# Patient Record
Sex: Male | Born: 1960 | Race: White | Hispanic: No | Marital: Married | Smoking: Never smoker
Health system: Southern US, Community
[De-identification: ages and names within clinical notes are randomized; demographics above are authoritative.]

---

## 1997-02-27 ENCOUNTER — Ambulatory Visit: Admission: RE | Admit: 1997-02-27 | Payer: Self-pay | Source: Ambulatory Visit | Admitting: Orthopaedic Surgery

## 1998-08-07 ENCOUNTER — Ambulatory Visit: Admission: RE | Admit: 1998-08-07 | Payer: Self-pay | Source: Ambulatory Visit | Admitting: Specialist

## 2008-05-09 DIAGNOSIS — H9319 Tinnitus, unspecified ear: Secondary | ICD-10-CM | POA: Insufficient documentation

## 2008-05-09 DIAGNOSIS — H903 Sensorineural hearing loss, bilateral: Secondary | ICD-10-CM | POA: Insufficient documentation

## 2008-05-24 DIAGNOSIS — J342 Deviated nasal septum: Secondary | ICD-10-CM | POA: Insufficient documentation

## 2008-06-30 ENCOUNTER — Ambulatory Visit
Admission: RE | Admit: 2008-06-30 | Disposition: A | Discharge status: Home / Self Care | Payer: Self-pay | Source: Ambulatory Visit | Admitting: Pediatric Otolaryngology

## 2008-10-16 DIAGNOSIS — J3489 Other specified disorders of nose and nasal sinuses: Secondary | ICD-10-CM | POA: Insufficient documentation

## 2009-05-25 ENCOUNTER — Ambulatory Visit
Admit: 2009-05-25 | Disposition: A | Discharge status: Home / Self Care | Payer: Self-pay | Source: Ambulatory Visit | Admitting: Internal Medicine

## 2019-04-15 ENCOUNTER — Encounter (INDEPENDENT_AMBULATORY_CARE_PROVIDER_SITE_OTHER): Payer: Self-pay

## 2020-01-03 ENCOUNTER — Emergency Department
Admission: EM | Admit: 2020-01-03 | Discharge: 2020-01-03 | Disposition: A | Discharge status: Home / Self Care | Payer: No Typology Code available for payment source | Attending: Emergency Medicine | Admitting: Emergency Medicine

## 2020-01-03 ENCOUNTER — Emergency Department: Payer: No Typology Code available for payment source

## 2020-01-03 DIAGNOSIS — R748 Abnormal levels of other serum enzymes: Secondary | ICD-10-CM | POA: Insufficient documentation

## 2020-01-03 DIAGNOSIS — R0602 Shortness of breath: Secondary | ICD-10-CM

## 2020-01-03 DIAGNOSIS — R7989 Other specified abnormal findings of blood chemistry: Secondary | ICD-10-CM

## 2020-01-03 DIAGNOSIS — R079 Chest pain, unspecified: Secondary | ICD-10-CM | POA: Insufficient documentation

## 2020-01-03 DIAGNOSIS — U071 COVID-19: Secondary | ICD-10-CM | POA: Insufficient documentation

## 2020-01-03 LAB — CBC AND DIFFERENTIAL
Absolute NRBC: 0 10*3/uL (ref 0.00–0.00)
Basophils Absolute Automated: 0.01 10*3/uL (ref 0.00–0.08)
Basophils Automated: 0.3 %
Eosinophils Absolute Automated: 0.01 10*3/uL (ref 0.00–0.44)
Eosinophils Automated: 0.3 %
Hematocrit: 45.9 % (ref 37.6–49.6)
Hgb: 15.1 g/dL (ref 12.5–17.1)
Immature Granulocytes Absolute: 0.01 10*3/uL (ref 0.00–0.07)
Immature Granulocytes: 0.3 %
Lymphocytes Absolute Automated: 0.71 10*3/uL (ref 0.42–3.22)
Lymphocytes Automated: 20.6 %
MCH: 29.2 pg (ref 25.1–33.5)
MCHC: 32.9 g/dL (ref 31.5–35.8)
MCV: 88.6 fL (ref 78.0–96.0)
MPV: 9.6 fL (ref 8.9–12.5)
Monocytes Absolute Automated: 0.25 10*3/uL (ref 0.21–0.85)
Monocytes: 7.3 %
Neutrophils Absolute: 2.45 10*3/uL (ref 1.10–6.33)
Neutrophils: 71.2 %
Nucleated RBC: 0 /100 WBC (ref 0.0–0.0)
Platelets: 141 10*3/uL — ABNORMAL LOW (ref 142–346)
RBC: 5.18 10*6/uL (ref 4.20–5.90)
RDW: 12 % (ref 11–15)
WBC: 3.44 10*3/uL (ref 3.10–9.50)

## 2020-01-03 LAB — COMPREHENSIVE METABOLIC PANEL
ALT: 17 U/L (ref 0–55)
AST (SGOT): 23 U/L (ref 5–34)
Albumin/Globulin Ratio: 1.3 (ref 0.9–2.2)
Albumin: 3.6 g/dL (ref 3.5–5.0)
Alkaline Phosphatase: 56 U/L (ref 38–106)
Anion Gap: 9 (ref 5.0–15.0)
BUN: 23.3 mg/dL (ref 9.0–28.0)
Bilirubin, Total: 0.5 mg/dL (ref 0.2–1.2)
CO2: 25 mEq/L (ref 22–29)
Calcium: 8.2 mg/dL — ABNORMAL LOW (ref 8.5–10.5)
Chloride: 99 mEq/L — ABNORMAL LOW (ref 100–111)
Creatinine: 1.4 mg/dL — ABNORMAL HIGH (ref 0.7–1.3)
Globulin: 2.7 g/dL (ref 2.0–3.6)
Glucose: 111 mg/dL — ABNORMAL HIGH (ref 70–100)
Potassium: 4.5 mEq/L (ref 3.5–5.1)
Protein, Total: 6.3 g/dL (ref 6.0–8.3)
Sodium: 133 mEq/L — ABNORMAL LOW (ref 136–145)

## 2020-01-03 LAB — TROPONIN I: Troponin I: 0.01 ng/mL (ref 0.00–0.05)

## 2020-01-03 LAB — LACTIC ACID, PLASMA: Lactic Acid: 0.9 mmol/L (ref 0.2–2.0)

## 2020-01-03 LAB — GFR: EGFR: 52.3

## 2020-01-03 LAB — IHS D-DIMER: D-Dimer: 0.42 ug/mL FEU (ref 0.00–0.60)

## 2020-01-03 MED ORDER — KETOROLAC TROMETHAMINE 30 MG/ML IJ SOLN
30.0000 mg | Freq: Once | INTRAMUSCULAR | Status: AC
Start: 2020-01-03 — End: 2020-01-03
  Administered 2020-01-03: 30 mg via INTRAVENOUS
  Filled 2020-01-03: qty 1

## 2020-01-03 MED ORDER — ALBUTEROL SULFATE HFA 108 (90 BASE) MCG/ACT IN AERS
2.0000 | INHALATION_SPRAY | Freq: Four times a day (QID) | RESPIRATORY_TRACT | Status: DC | PRN
Start: 2020-01-03 — End: 2020-01-04
  Administered 2020-01-03: 2 via RESPIRATORY_TRACT
  Filled 2020-01-03: qty 8

## 2020-01-03 MED ORDER — SODIUM CHLORIDE 0.9 % IV BOLUS
500.0000 mL | Freq: Once | INTRAVENOUS | Status: AC
Start: 2020-01-03 — End: 2020-01-03
  Administered 2020-01-03: 500 mL via INTRAVENOUS

## 2020-01-03 MED ORDER — ACETAMINOPHEN 500 MG PO TABS
1000.0000 mg | ORAL_TABLET | Freq: Once | ORAL | Status: AC
Start: 2020-01-03 — End: 2020-01-03
  Administered 2020-01-03: 1000 mg via ORAL
  Filled 2020-01-03: qty 2

## 2020-01-03 NOTE — ED Provider Notes (Signed)
EMERGENCY DEPARTMENT HISTORY AND PHYSICAL EXAM          Date Time: 01/03/20 11:46 PM  Patient Name: Gregory Silva, Gregory Silva  Mid level Provider: Edwyna Ready Ellisyn Icenhower, PA-C    History of Presenting Illness:     Chief Complaint: SOB  History obtained from: Patient.    Narrative/Additional Historical Findings:Gregory Silva is a 59 y.o. male who presents with cough, congestion, fevers, chills, sweats, fatigue, body aches, CP, and SOB that has been progressively getting worse for the past 7 days. Pt states he took a COVID test on day 1 of symptoms and was informed it was positive the next day; reports unsure of when his exposure was. Pt states he has been quarantining in a hotel and has been taking advil for fevers, last dose was this morning. Denies any sore throat, abd pain, n/v/d, urinary symptoms. Denies any recent travel. Denies smoking.     Nursing notes from this date of service were reviewed.    Past Medical History:   History reviewed. No pertinent past medical history.  Immunizations:    Past Surgical History:   History reviewed. No pertinent surgical history.    Family History:   History reviewed. No pertinent family history.    Social History:     Social History     Socioeconomic History    Marital status: Married     Spouse name: Not on file    Number of children: Not on file    Years of education: Not on file    Highest education level: Not on file   Occupational History    Not on file   Tobacco Use    Smoking status: Never Smoker    Smokeless tobacco: Never Used   Substance and Sexual Activity    Alcohol use: Not on file    Drug use: Not on file    Sexual activity: Not on file   Other Topics Concern    Not on file   Social History Narrative    Not on file     Social Determinants of Health     Financial Resource Strain:     Difficulty of Paying Living Expenses:    Food Insecurity:     Worried About Running Out of Food in the Last Year:     Barista in the Last Year:    Transportation Needs:      Freight forwarder (Medical):     Lack of Transportation (Non-Medical):    Physical Activity:     Days of Exercise per Week:     Minutes of Exercise per Session:    Stress:     Feeling of Stress :    Social Connections:     Frequency of Communication with Friends and Family:     Frequency of Social Gatherings with Friends and Family:     Attends Religious Services:     Active Member of Clubs or Organizations:     Attends Banker Meetings:     Marital Status:    Intimate Partner Violence:     Fear of Current or Ex-Partner:     Emotionally Abused:     Physically Abused:     Sexually Abused:        Allergies:   No Known Allergies    Medications:     Current Facility-Administered Medications:     albuterol sulfate HFA (PROVENTIL) inhaler 2 puff, 2 puff, Inhalation, Q6H PRN, Jonny Dearden A, PA, 2  puff at 01/03/20 2157  No current outpatient medications on file.    Review of Systems:   Constitutional: +fever, chills, sweats, fatigue   Eyes: No eye redness. No eye discharge. No eye pain.   ENT: No ear pain or sore throat. +congestion   Cardiovascular: +cp; no palpitations. No leg swelling   Respiratory: +cough, shortness of breath.  GI: No abd pain, nausea, vomiting or diarrhea.  Genitourinary: No dysuria or hematuria or urinary frequency   Musculoskeletal: No extremity pain or decreased use; +body aches   Skin: no rash or skin lesions.  Neurologic: Normal level of alertness; no headache or dizziness   Psychiatric:  All other systems reviewed and are negative  Physical Exam:     ED Triage Vitals [01/03/20 1856]   Enc Vitals Group      BP 120/90      Heart Rate (!) 107      Resp Rate 20      Temp (!) 102.3 F (39.1 C)      Temp src       SpO2 97 %      Weight 95.3 kg      Height 1.88 m      Head Circumference       Peak Flow       Pain Score 2      Pain Loc       Pain Edu?       Excl. in GC?      Constitutional: Vital signs reviewed. Well hydrated, well perfused, and no increased  work of breathing. Warm to touch  Head: Normocephalic, atraumatic  Eyes: No conjunctival injection. No discharge. EOMI  ENT: Mucous membranes moist, No oral lesions, TMs wnl, posterior oropharynx with mild erythema, nasal congestion  Neck: Normal range of motion. Non-tender.  Respiratory/Chest: Clear to auscultation. No respiratory distress. Coughing with deep inspiration  Cardiovascular: Regular rhythm. No murmur. Tachycardic   Abdomen: Soft, non-tender, non-distended   UpperExtremity: No edema or cyanosis.  Moving well.  LowerExtremity: No edema or cyanosis.  Moving well.  Neurological: No focal motor deficits by observation. Speech normal. Memory normal.  Skin: Warm and dry. No rash.    Labs:     Labs Reviewed   CBC AND DIFFERENTIAL - Abnormal; Notable for the following components:       Result Value    Platelets 141 (*)     All other components within normal limits   COMPREHENSIVE METABOLIC PANEL - Abnormal; Notable for the following components:    Glucose 111 (*)     Creatinine 1.4 (*)     Sodium 133 (*)     Chloride 99 (*)     Calcium 8.2 (*)     All other components within normal limits   CULTURE BLOOD AEROBIC AND ANAEROBIC    Narrative:     Indications:->Sepsis  1 BLUE+1 PURPLE   CULTURE BLOOD AEROBIC AND ANAEROBIC   TROPONIN I   LACTIC ACID, PLASMA   GFR   IHS D-DIMER         Rads:     Radiology Results (24 Hour)     Procedure Component Value Units Date/Time    Chest AP Portable [[id number]] Collected: 01/03/20 2126    Order Status: Completed Updated: 01/03/20 2129    Narrative:      HISTORY:  Chest pain and shortness of breath.     COMPARISON: 05/25/2009     PORTABLE CHEST:    FINDINGS: The heart size  and contour are normal.  There is normal  pulmonary vascularity. Lungs demonstrate basilar atelectasis.   No  pleural effusion, hilar or mediastinal prominence is evident.  No  pneumothorax is seen.       Impression:       Basilar atelectasis.    Geanie Cooley, MD   01/03/2020 9:27 PM          MDM and ED  Course   I, Kerria Sapien A. Amelia Burgard PA-C, have been the primary provider for Nolon Nations during this Emergency Dept visit.  Nursing notes, PMH, SH reviewed.   The attending signature signifies review and agreement of the history, physical examination, evaluation, clinical impression, and plan except as noted.     Oxygen saturation by pulse oximetry is 95%-100%, Normal.  Interventions: None Needed.    When I was within 6 feet of this patient I donned the following PPE:  Surgical Mask Yes, Gloves Yes, Gown Yes; Goggles No  ; Face Shield Yes, 46M 6000 Respirator No  ; N95 Yes.  The patient was wearing a mask during my evaluation Yes.      DDX  COVID, PNA, Viral syndrome, Electrolyte abnormality, Dehydration, Sepsis, ACS, Arrhythmia    Plan: will order labs, UA, CXR, EKG, lactate + blood cultures and administer IVF, tylenol    Pt requesting pain medicine for body aches - will order toradol     EKG: sinus tachycardia, rate 101, nl axis, no stemi  Troponin negative  D dimer negative  CMP with creatinine 1.4   Labs otherwise unremarkable  Lactate wnl - blood cultures pending  CXR with no acute findings    VSS on monitor and improved - ambulatory pulse ox per RN remained 97% on room air. Results reviewed with pt who is requesting admission, and if not able to get admitted, is requesting monoclonal antibody infusion. Reviewed with pt that the infusions are typically within 10 days of symptom onset for high risk patients (with strict criteria) that are discharged from the ED. Pt is requesting an exception - but given strict regulations, reviewed with pt the treatment is still largely supportive upon discharge today. Pt was d/c home with albuterol inhaler used in ED and pulse oximeter. Very strict return precautions were reviewed in detail with pt. He expressed understanding.       Assessment/Plan:   Results and instructions reviewed at the bedside with patient and family.    Clinical Impression  Final diagnoses:   COVID-19    Shortness of breath   Elevated serum creatinine       Disposition  ED Disposition     ED Disposition Condition Date/Time Comment    Discharge  Tue Jan 03, 2020 10:48 PM Nolon Nations discharge to home/self care.    Condition at disposition: Stable            Prescriptions  There are no discharge medications for this patient.          Signed by: [address] A Aisha Greenberger, PA-C           Chadrick Sprinkle, [place], [place]  01/03/20 2346       Ferdinand Cava, MD  01/07/20 1535

## 2020-01-03 NOTE — ED Triage Notes (Signed)
covid pos 6 days ago. Worsening sob and pain with coughing.

## 2020-01-04 ENCOUNTER — Telehealth (INDEPENDENT_AMBULATORY_CARE_PROVIDER_SITE_OTHER): Payer: No Typology Code available for payment source | Admitting: Physician Assistant

## 2020-01-04 ENCOUNTER — Encounter (INDEPENDENT_AMBULATORY_CARE_PROVIDER_SITE_OTHER): Payer: Self-pay | Admitting: Physician Assistant

## 2020-01-04 ENCOUNTER — Encounter (INDEPENDENT_AMBULATORY_CARE_PROVIDER_SITE_OTHER): Payer: Self-pay

## 2020-01-04 VITALS — Temp 101.7°F

## 2020-01-04 DIAGNOSIS — Z20822 Contact with and (suspected) exposure to covid-19: Secondary | ICD-10-CM

## 2020-01-04 DIAGNOSIS — R0789 Other chest pain: Secondary | ICD-10-CM

## 2020-01-04 DIAGNOSIS — R0602 Shortness of breath: Secondary | ICD-10-CM

## 2020-01-04 LAB — ECG 12-LEAD
Atrial Rate: 101 {beats}/min
P Axis: 48 degrees
P-R Interval: 150 ms
Q-T Interval: 312 ms
QRS Duration: 92 ms
QTC Calculation (Bezet): 404 ms
R Axis: 49 degrees
T Axis: 38 degrees
Ventricular Rate: 101 {beats}/min

## 2020-01-04 NOTE — Progress Notes (Signed)
Video Visit provided to patient to minimize exposure to COVID-19  Originating site (Patient location): Hotel (pt would not disclose location of hotel), he is alone  Distant site (Provider location): Office  Mode of communication: Video  Verbal consent has been obtained: Yes    CC:  COVID-19, MAB (monoclonal antibody) assessment    History of Present Illness:   Mr Alim Cattell. Manganaro is a 59 y.o. male self [facility] assessment.  Pt reports he has COVID-19, his symptoms started last Wed 12/28/2019 and reports myalgias, sob, chest pain  He reports he was evaluated at [facility] yesterday and is frustrated that he was not given monoclonal antibody treatment.    He reports increasing dyspnea today and has chest pain  He is currently alone in a hotel.    He does not have a finger pulse oximeter    Referred by: Son/self  PCP: Dr Jesse Sans ANTIBODY INFUSION ASSESSMENT   CONTRAINDICATIONS:  1. Hospitalized: no  2. Requiring new oxygen therapy: No  3. Requiring an increase in baseline oxygen flow rate due to COVID-19 for those on chronic oxygen: No    CRITERIA FOR INFUSION (must meet all 5 criteria)  1. 12 years and older AND 40 kg or greater: Yes  2. Confirmed SARS-CoV-2: unclear, not in Epic  3. Presents within 10 days of symptom onset:  Yes -  Date of Onset: 12/28/2019  4. Symptomatic with Mild/moderate disease severity not requiring hospital admission/supplemental oxygen: Pt reporting increasing dyspnea and chest pain  5. Meet at least one high risk criteria for progressing to severe COVID-19 and/or hospitalization (see below): No    INFUSION CRITERIA: pt does not meet the following:  COVID INFUSION ADULT: body mass index (BMI) 35 or greater, chronic kidney disease , diabetes, immunosuppressive disease , currently receiving immunosuppressive treatment , 65 years of age or greater, 68 to 57 years of age AND have cardiovascular disease, hypertension, chronic  obstructive pulmonary disease/other chronic respiratory disease and 12 to 81 with BMI equal to or above the 85th percentile for age, sickle sell disease, neurodevelopmental disorder, congenital or acquired heart disease, dependence on positive pressure ventilation, presence of trach/PEG, or chronic lung disease requiring controller meds    History reviewed. No pertinent past medical history.  History reviewed. No pertinent surgical history.  History reviewed. No pertinent family history.  Social History     Tobacco Use    Smoking status: Never Smoker    Smokeless tobacco: Never Used   Substance Use Topics    Alcohol use: Not on file    Drug use: Not on file       Allergies:   No Known Allergies    Medications:     Current Outpatient Medications:     Ascorbic Acid (Vitamin C) 500 MG Cap, Take by mouth, Disp: , Rfl:     ibuprofen (ADVIL) 200 MG tablet, Take 200 mg by mouth every 6 (six) hours as needed for Pain, Disp: , Rfl:     methylphenidate (METADATE CD) 40 MG CR capsule, Take 40 mg by mouth every morning, Disp: , Rfl:     methylphenidate (RITALIN) 20 MG tablet, Take 20 mg by mouth every morning, Disp: , Rfl:     Nutritional Supplements (VITAMIN D BOOSTER PO), Take by mouth, Disp: , Rfl:     tadalafil (CIALIS) 20 MG tablet, TAKE ONE TABLET EVERYDAY AS NEEDED, Disp: , Rfl:   No current  facility-administered medications for this visit.    Physical Exam:   Temp (!) 101.7 F (38.7 C)   Wt Readings from Last 3 Encounters:   01/03/20 95.3 kg (210 lb)       Physical Exam   GENERAL APPEARANCE: pt in bed, ill appearing  HEAD: normal appearance  EYES: anicteric  PULM: appears tachypneic  SKIN: no pallor  NEURO/PSYCH: decreased mental status on video    Diagnostics:     Lab Results   Component Value Date    WBC 3.44 01/03/2020    HGB 15.1 01/03/2020    HCT 45.9 01/03/2020    PLT 141 (L) 01/03/2020    ALT 17 01/03/2020    AST 23 01/03/2020    NA 133 (L) 01/03/2020    K 4.5 01/03/2020    CL 99 (L) 01/03/2020     CREAT 1.4 (H) 01/03/2020    BUN 23.3 01/03/2020    CO2 25 01/03/2020    GLU 111 (H) 01/03/2020       Microbiology Results     None          Chest AP Portable    Result Date: 01/03/2020   Basilar atelectasis. Geanie Cooley, MD  01/03/2020 9:27 PM      Assessment/Plan:       Jonny Ruiz was seen today for mab eval.    Diagnoses and all orders for this visit:    Suspected COVID-19 virus infection    Shortness of breath    Other chest pain    Plan:  Pt does not meet strict criteria for monoclonal antibody infusion treatment  I advised pt that he needs to be re-evaluated in the ER and to call 911 since he is ill appearing, alone and appears unstable, he declined going to the ER, he declined my request to contact his son, he would not disclose his location and ended the video visit against recommendations and my attempt to assist him while on video.  He is ill appearing on video, due to concerns of his deteriorating condition, possibly life-threatening and in an emergency situation, shortly after Mr. Tolleson ended the video visit, I contacted his (ex) wife, Anna Livers, at [phone] (listed in the demographics in his chart) to see if she could locate Mr. Culbreth.  I spoke with Ms. Dashon Mcintire again around noon and was informed Mr Strohmeier is at [facility] in [place] on [address].  I called 911 shortly after I spoke with Ms. Keison Glendinning and informed 911 dispatcher of the situation (spoke with Operator 24) and requested EMS be sent out to locate and evaluate pt.

## 2020-01-04 NOTE — Progress Notes (Signed)
Identification verified with a driver's license. Pt is currently in the state of [place]. Pt is by themself in the room.

## 2020-01-05 ENCOUNTER — Telehealth: Payer: Self-pay

## 2020-01-05 NOTE — ED Notes (Signed)
Prelim bc 1/1 : gram positive cocci in cluster: contamination vs real :     Post care team : to contact pt today: Recommend to contact pt : how is pt feeling ? Has pt had f/u ? If worsening ssx return to ED.   Repeat bc with pcp . Or return to ED if worsening ssx.     Final cx results back in 24-48 hours.   Note sent to pcp via secure chat.          Leonia Reeves, MD  01/05/20 [phone]

## 2020-01-05 NOTE — Telephone Encounter (Signed)
Attempted to contact patient on home phone. VM is full. According to chart patient is residing at the [facility] in [place] on [address]. Is estranged from his wife. Dr. Salli Real notified of unable to contact patient. Pt had a televisit yesterday by Hale Bogus, PA from the [place] [facility] where pt was ill-appearing, refused to come back to ED. Hale Bogus, PA contacted ex-wife who informed location where patient was staying and she called EMS to check on patient. No further documentation in chart regarding pt current status.                 covid positive pt , with bc : 1/1 prelim : gram positive cocci in cluster.: could be contamination, vs real. recommend to contact pt : how is pt feeling , has pt had f/u, if worsening ssx return to ED. final results will be back in 24-48 hours. . recommend to repeat bc with pcp to confirm real vs contamination; again if pt feels worse can return to ED for re-evaluation or attempt televisit with pcp .

## 2020-01-10 ENCOUNTER — Telehealth: Payer: Self-pay

## 2020-01-10 NOTE — Telephone Encounter (Addendum)
Unable to leave message on patient's phone, voicemail full.  Left message to return call regarding lab results on [phone] from [facility] Everywhere.  Contacted patient's PCP, patient has not been seen since February of this year.  Faxed to PCP.    ----- Message from Leonia Reeves, MD sent at 01/10/2020  7:31 AM EDT -----  Bc : growth of staph epidermidis:   Per EMS : pt was taken to [facility], unknown if pt admitted. , I contacted [facility]: he is not a pt there as of today.     Recommend to contact pt : how is pt feeling ? Has pt had f/u ? If worsening ssx return to ED.

## 2020-01-10 NOTE — Telephone Encounter (Addendum)
Patient returned call regarding lab results.  States he is feeling better, he has been having night sweats, but has not checked his temperature, has not yet followed up with PCP. Informed of positive BC that it is possibly a contamination and to have repeat BC done with PCP.  Instructed patient that if he is feeling worse, having high fevers, he should return to ED.  Patient states he will call PCP.  Verbalized understanding.     ----- Message from Leonia Reeves, MD sent at 01/10/2020  7:31 AM EDT -----  Bc : growth of staph epidermidis:   Per EMS : pt was taken to [facility], unknown if pt admitted. , I contacted [facility]: he is not a pt there as of today.     Recommend to contact pt : how is pt feeling ? Has pt had f/u ? If worsening ssx return to ED.

## 2020-06-11 IMAGING — DX LUMBAR SPINE AP & LAT WITH FLEXION AND EXTENSION
1 series · 6 of 6 positions shown · non-contrast
Comparison: None

LUMBAR SPINE AP \T\T\T\ LAT WITH FLEXION AND EXTENSION, 06/11/2020 [DATE]: 
CLINICAL INDICATION: Back pain

[Series 1: lateral · 0.14mm/px · 6 of 6 slices shown]
[im 1/6]
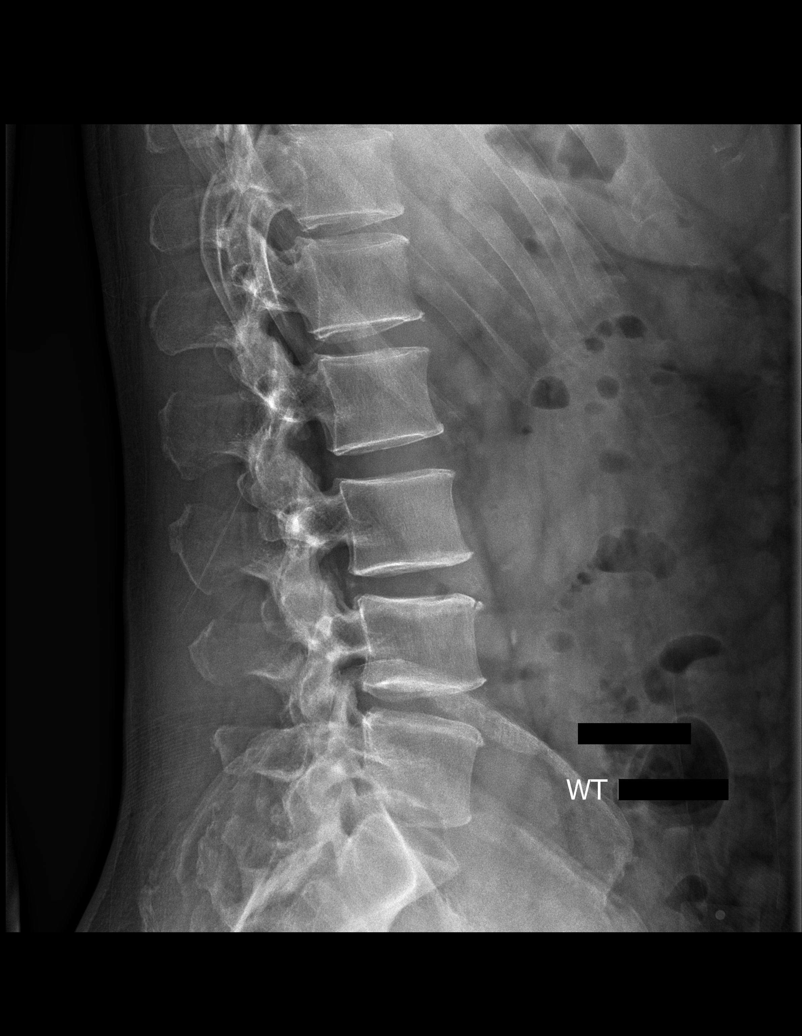
[im 2/6]
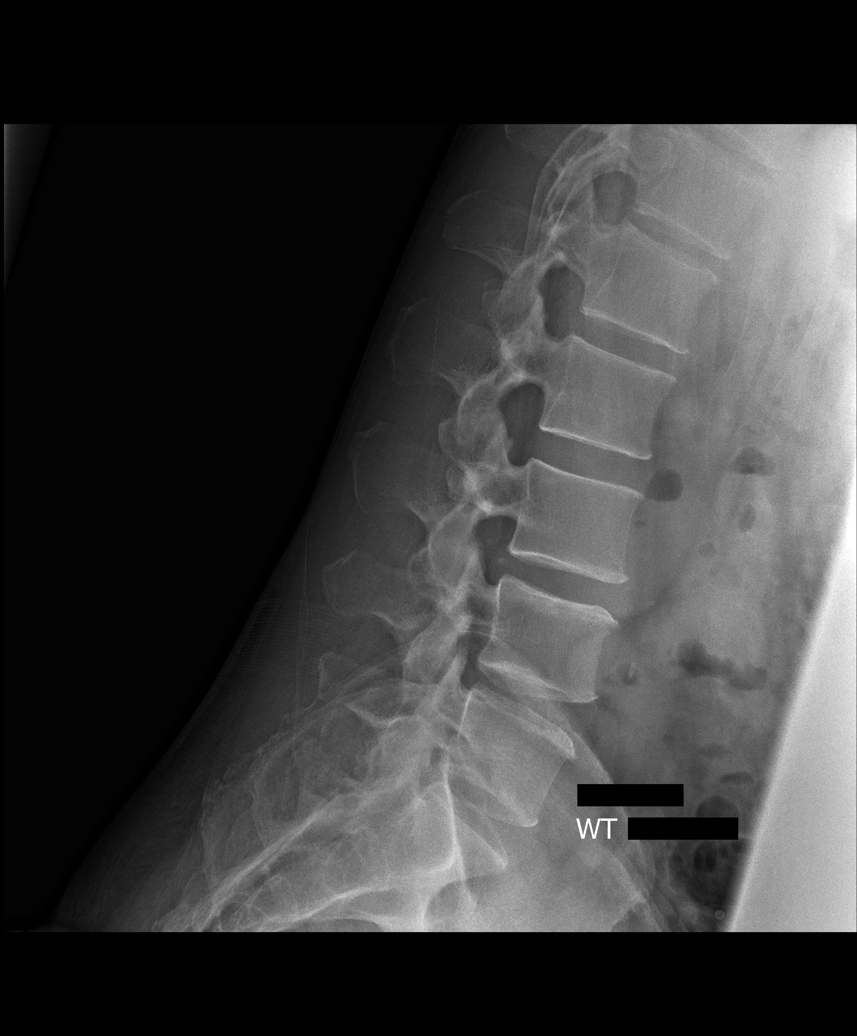
[im 3/6]
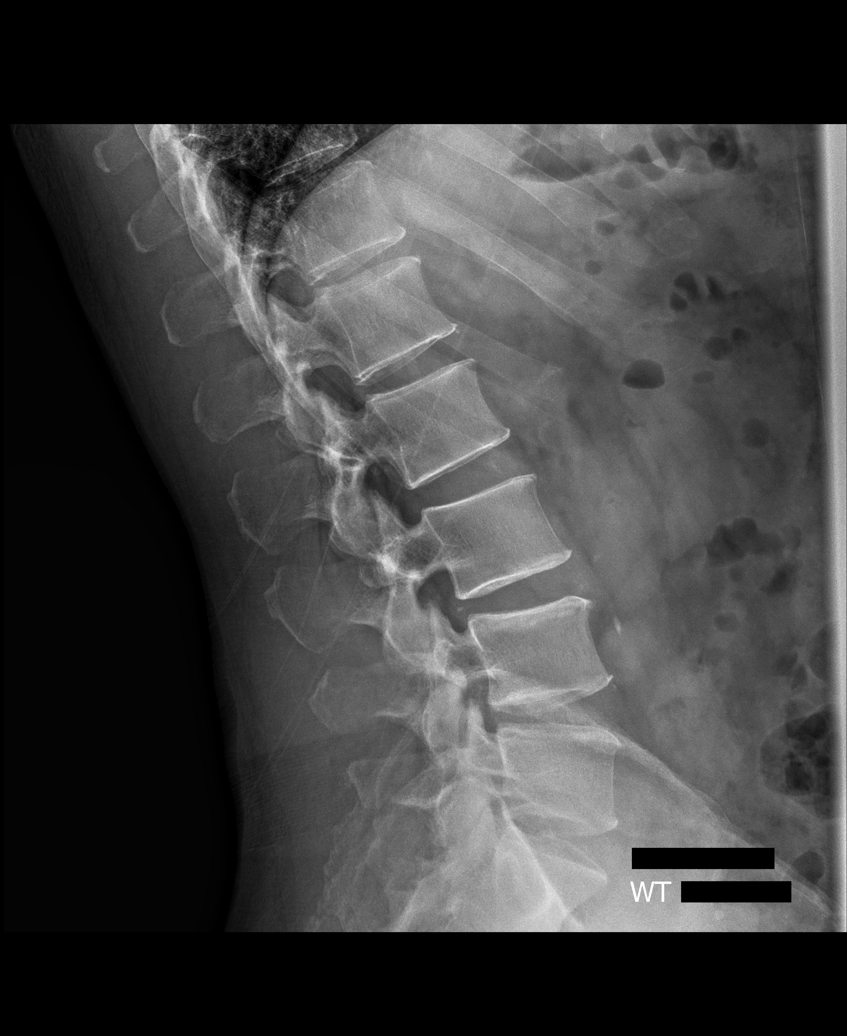
[im 4/6]
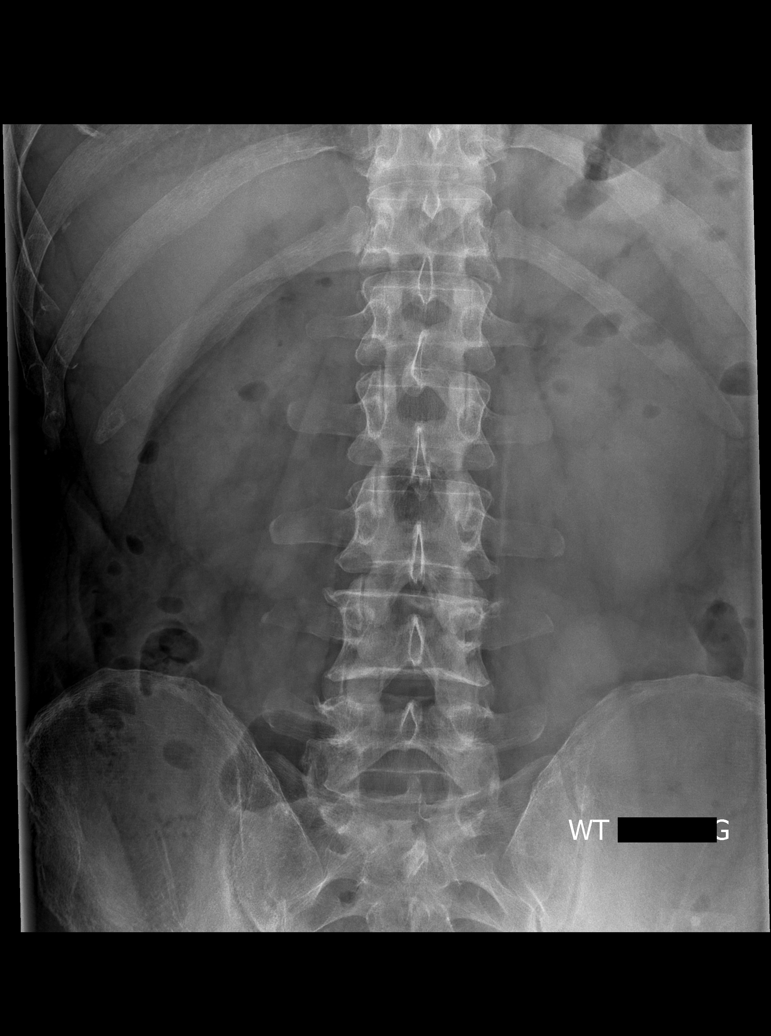
[im 5/6]
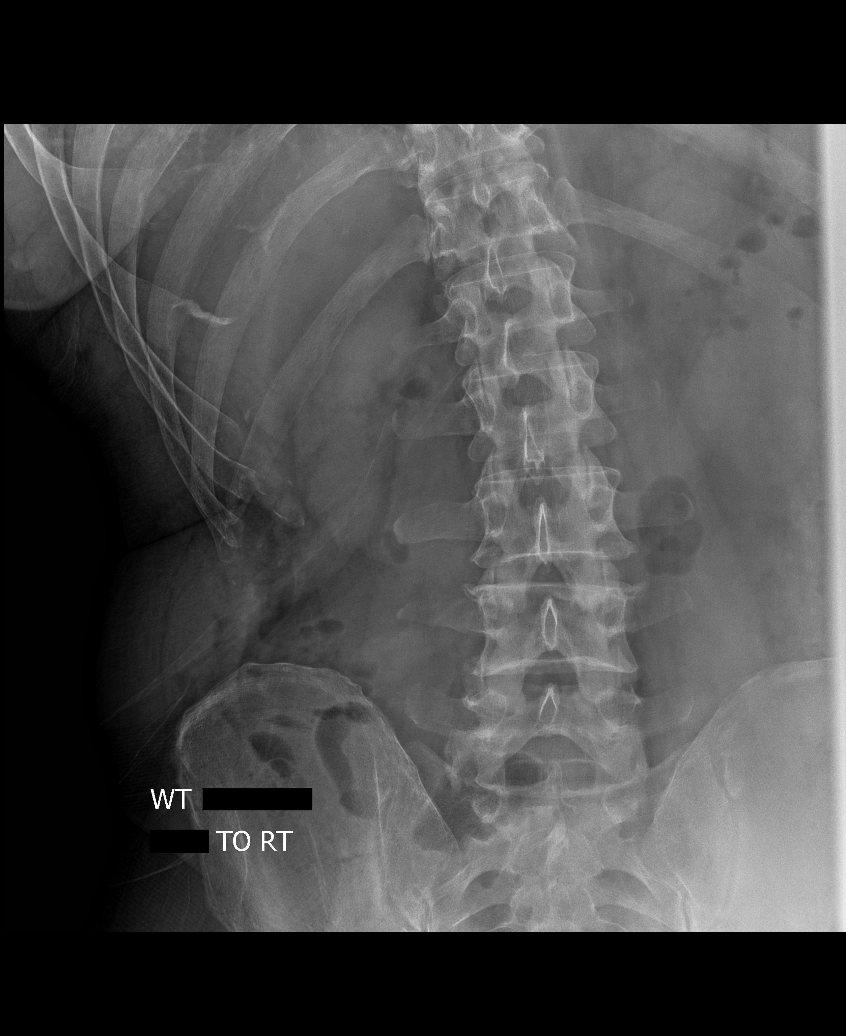
[im 6/6]
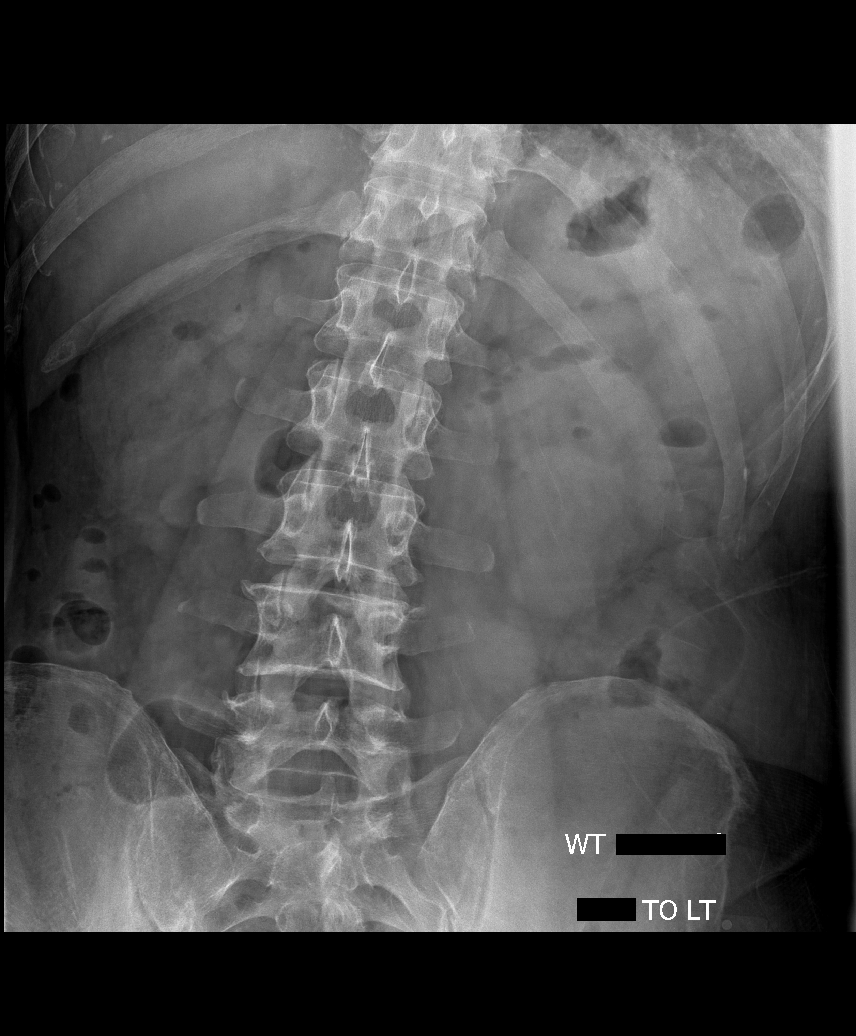

[6 of 6 positions shown; findings below may reference images not displayed]

FINDINGS: The neutral lateral view shows no significant anterolisthesis. Flexion-extension 
views show slight retrolisthesis at L4-5. Oblique views were not obtained. 
Lateral bending views show no lateral listhesis there are facet changes 
throughout the lumbar spine. There is mild to moderate disc narrowing at L4-5. 
Other disc heights are intact. Vertebral heights are intact. There is no 
scoliosis.
IMPRESSION: Degenerative changes, most pronounced in the lumbar facet joints and L4-5. MRI 
would be useful if indicated.

## 2021-09-04 IMAGING — MR MRI RIGHT KNEE WITHOUT CONTRAST
4 of 6 series · 26 of 40 positions shown · IV contrast (gadolinium)
Comparison: None

________________________________________________________________________________________________ 
MRI RIGHT KNEE WITHOUT CONTRAST, 09/04/2021 [DATE]: 
CLINICAL INDICATION: Right knee pain. Rule out loose body, meniscal tear and 
osteoarthritis. ACL reconstruction in 6364/6262.
TECHNIQUE: Multiplanar, multiecho position MR images of the knee were performed 
using CHANTALE protocol without intravenous gadolinium enhancement. Patient was 
scanned on a 1.5T magnet.

[Series 301: survey_(person_name)_(person_name) · axial · 8.0mm · 1.17mm/px · z∈[-32,+149]mm · 4 of 15 slices shown]
[im 1/15]
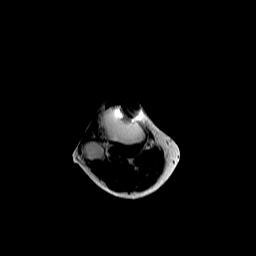
[im 5/15]
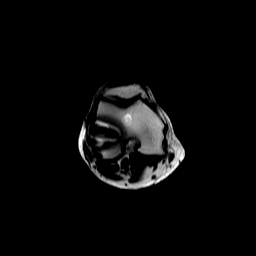
[im 10/15]
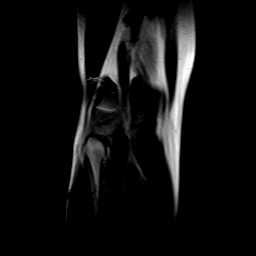
[im 15/15]
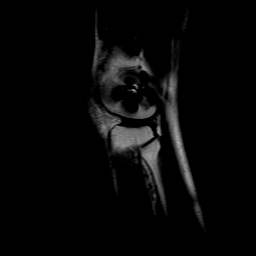

[Series 401: stir_(person_name)*-(person_name) · axial · 4.0mm · 0.62mm/px · z∈[-65,+90]mm · 8 of 32 slices shown]
[im 1/32]
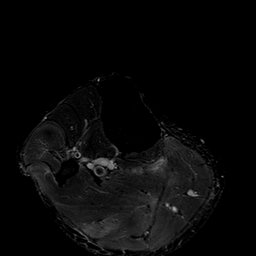
[im 5/32]
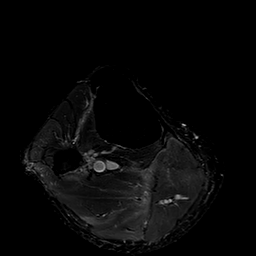
[im 9/32]
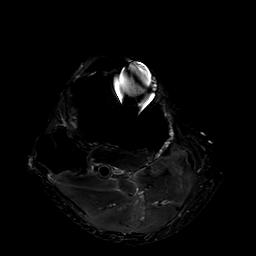
[im 14/32]
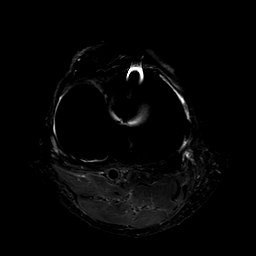
[im 18/32]
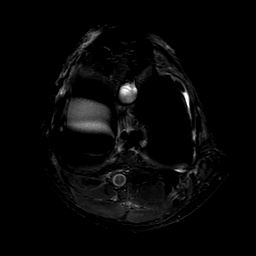
[im 23/32]
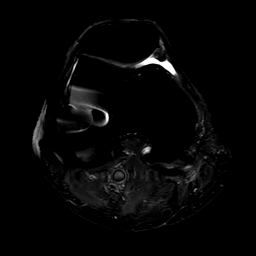
[im 27/32]
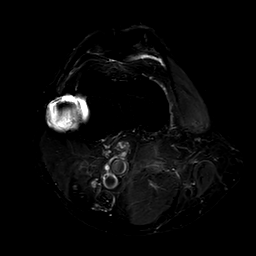
[im 32/32]
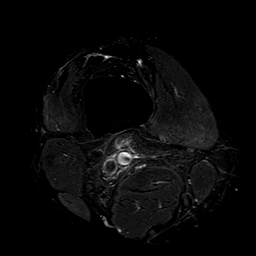

[Series 501: pd_sag fh*-(person_name) · sagittal · 3.5mm · 0.31mm/px · 7 of 32 slices shown]
[im 1/32]
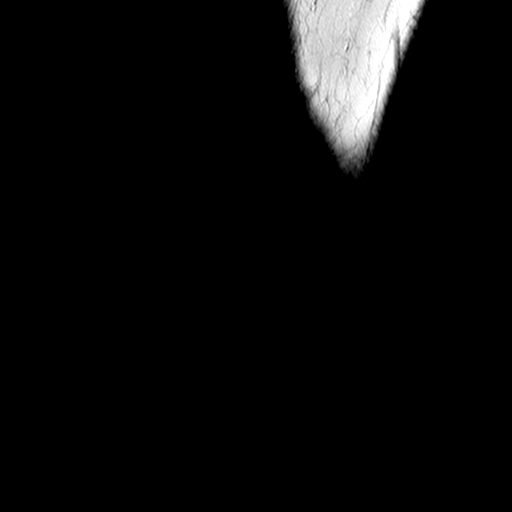
[im 6/32]
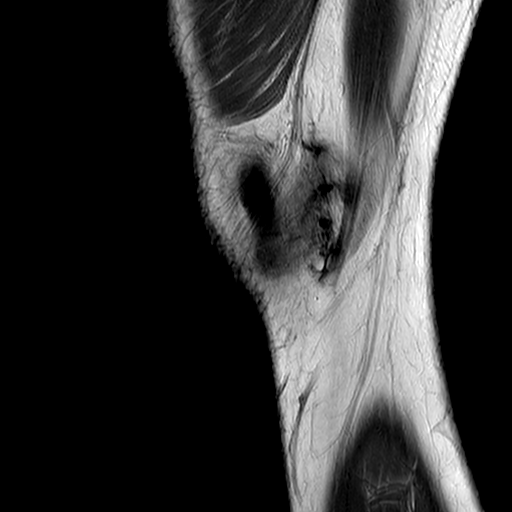
[im 11/32]
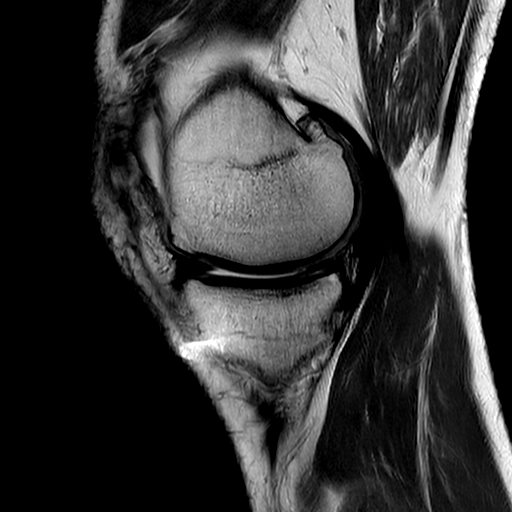
[im 16/32]
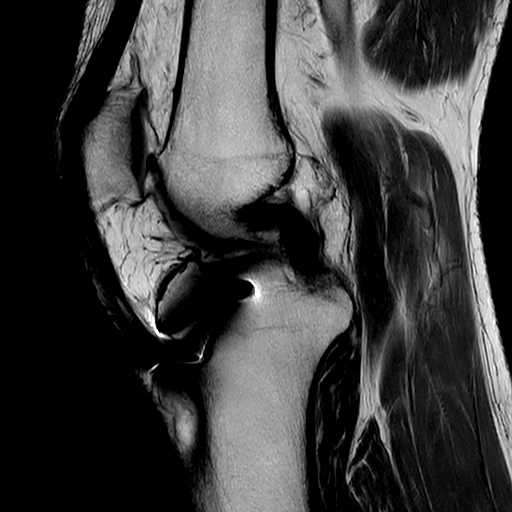
[im 21/32]
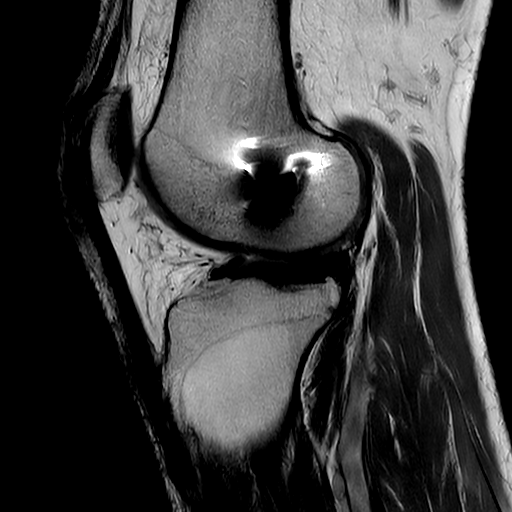
[im 26/32]
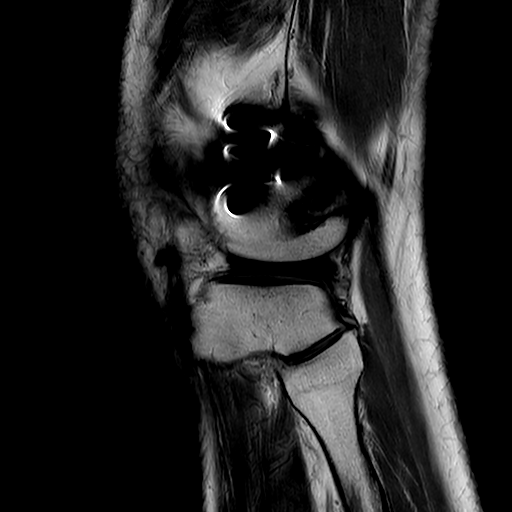
[im 32/32]
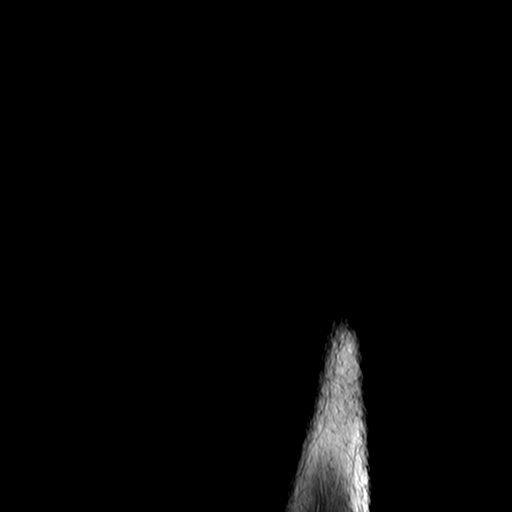

[Series 601: t1_(person_name) · coronal · 3.6mm · 0.30mm/px · 7 of 32 slices shown]
[im 1/32]
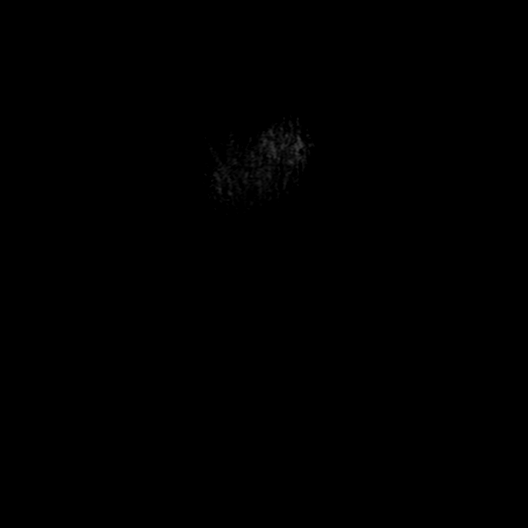
[im 6/32]
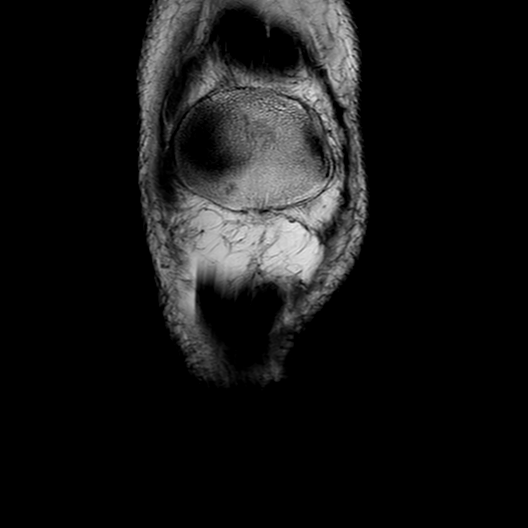
[im 11/32]
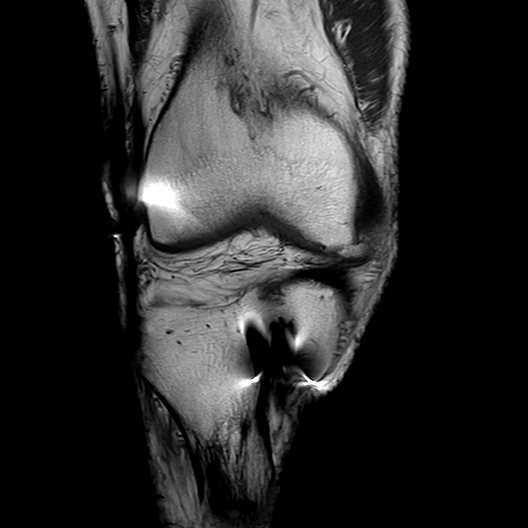
[im 16/32]
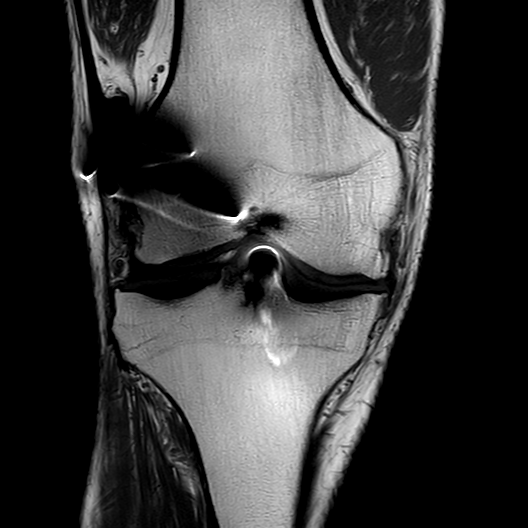
[im 21/32]
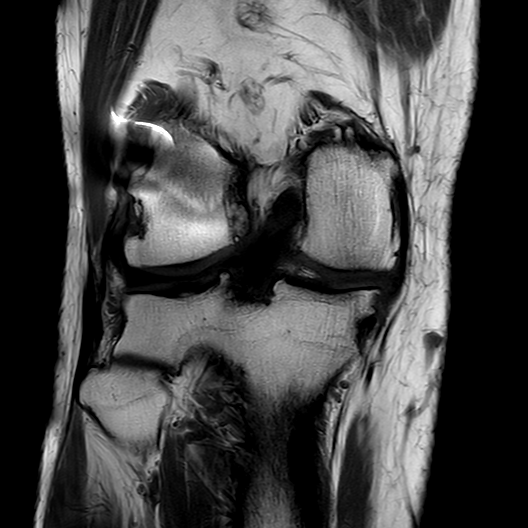
[im 26/32]
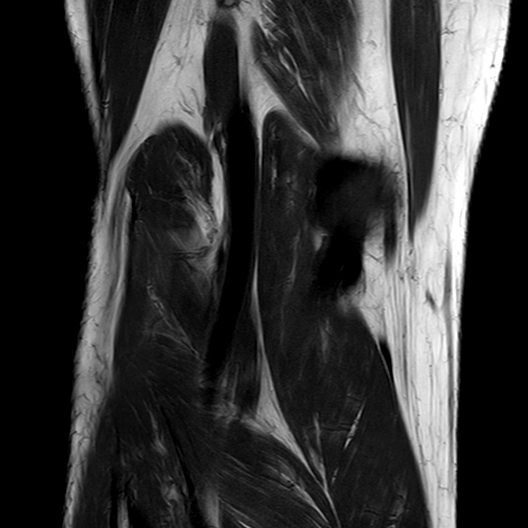
[im 32/32]
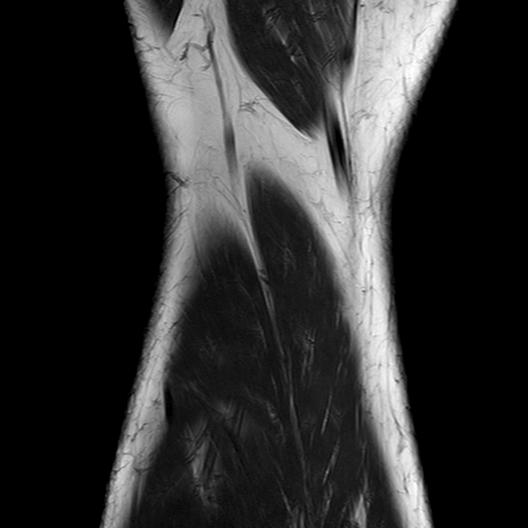

[26 of 40 positions shown; findings below may reference images not displayed]

FINDINGS: MEDIAL COMPARTMENT: Complex tear and truncation of the posterior horn and body 
of the medial meniscus with full-thickness radial tear of the body. 
Full-thickness rupture of the posterior medial meniscal root ligament. Up to 
grade III chondromalacia. Small marginal and central osteophytes.  
LATERAL COMPARTMENT: Small horizontal tear of the posterior horn of the lateral 
meniscus. Up to grade II chondromalacia. Small marginal and central osteophytes. 
PATELLOFEMORAL COMPARTMENT: The patella is centrally located. Up to grade II 
chondromalacia and patellar osteophytes  
TIBIOFIBULAR COMPARTMENT: Negative. 
LIGAMENTS: The anterior cruciate ligament graft is intact but mildly deviated by 
the 0.8 cm lateral femoral condylar broad-based exostosis extending into the 
intercondylar notch (sequence 601 images 8480). The posterior cruciate ligament 
is intact. The medial collateral ligament and lateral collateral ligaments are 
preserved. 
EXTENSOR MECHANISM: Donor defect of the middle third of the patellar tendon. The 
quadriceps tendon is preserved. The medial and lateral retinacula are intact. 
POSTEROMEDIAL CORNER: The semimembranosus and pes anserine tendons are 
preserved. The posterior oblique ligament and posterior medial joint capsule are 
intact. 
POSTEROLATERAL CORNER: The popliteal tendon and popliteofibular ligament are 
intact. The biceps femoris is negative. 
BONES: Metallic ACL graft hardware fixation and degenerative defects of the 
central inferior patella/tibial tuberosity. No fracture or contusion or stress 
response.  
ADDITIONAL FINDINGS: No knee joint effusion. No intra-articular bodies. No 
popliteal cyst. The musculature is normal without mass, signal abnormality or 
atrophy. Neurovascular bundles are negative. Subcutaneous tissues are negative.
IMPRESSION: 1.  The anterior cruciate ligament graft is intact but mildly deviated by the 
lateral femoral condylar exostosis. 
2.  Complex tear/truncation of the posterior horn and body of the medial 
meniscus with full-thickness radial tear of the body and posterior medial 
meniscal root ligament.  
3.  Small horizontal tear of the posterior horn of the lateral meniscus. 
4.  Moderate medial compartment and mild lateral/patellofemoral compartment 
degenerative change. No intra-articular bodies.

## 2022-01-06 IMAGING — MR MRI RIGHT FEMUR WITHOUT CONTRAST
3 of 6 series · 7 of 40 positions shown · IV contrast (gadolinium)
Comparison: None

________________________________________________________________________________________________ 
MRI RIGHT FEMUR WITHOUT CONTRAST, 01/06/2022 [DATE]: 
CLINICAL INDICATION: Unilateral primary osteoarthritis, right knee pain.
TECHNIQUE: Multiplanar, multiecho position MR images of the right femur/thigh 
were performed without intravenous gadolinium enhancement. Patient was scanned 
on a 1.5T magnet.

[Series 201: (person_name)2(person_name)_(person_name) · axial · 6.0mm · 0.59mm/px · z∈[-13,+212]mm · 3 of 15 slices shown (1 of 2)]
[im 1/15]
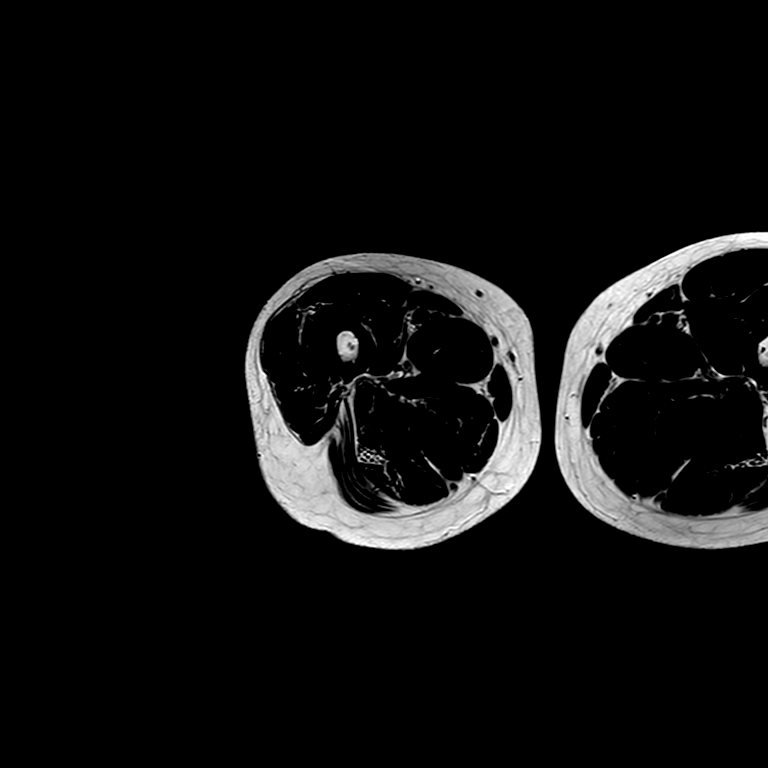
[im 8/15]
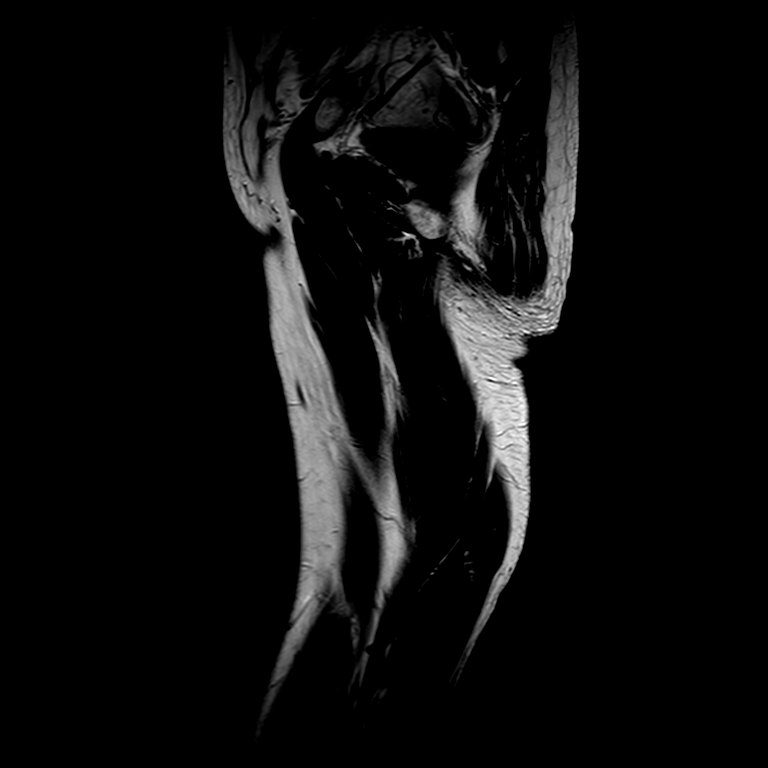
[im 15/15]
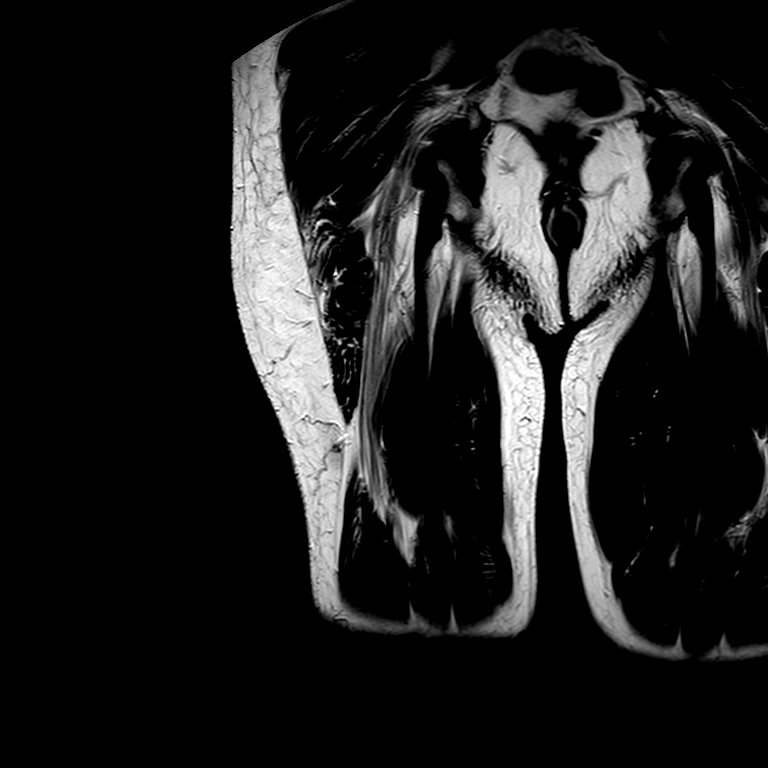

[Series 301: (person_name)2(person_name)_(person_name) · axial · 6.0mm · 0.59mm/px · z∈[-258,+86]mm · 3 of 15 slices shown (2 of 2)]
[im 1/15]
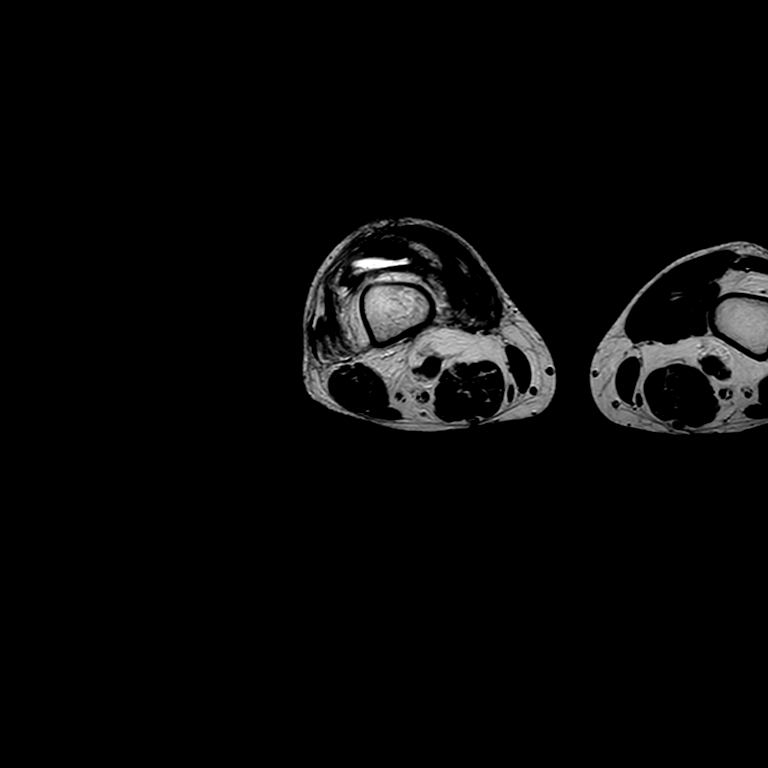
[im 8/15]
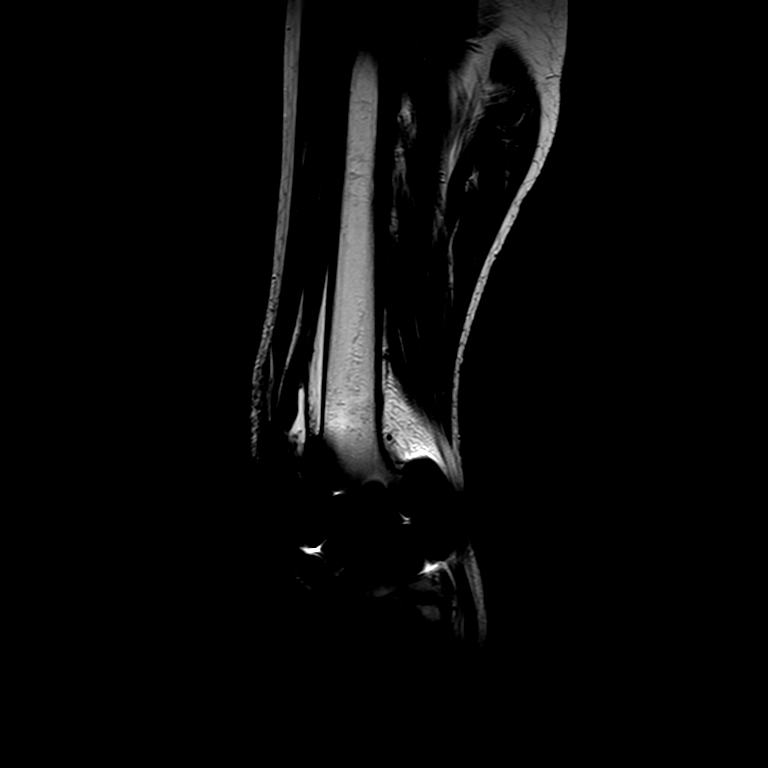
[im 15/15]
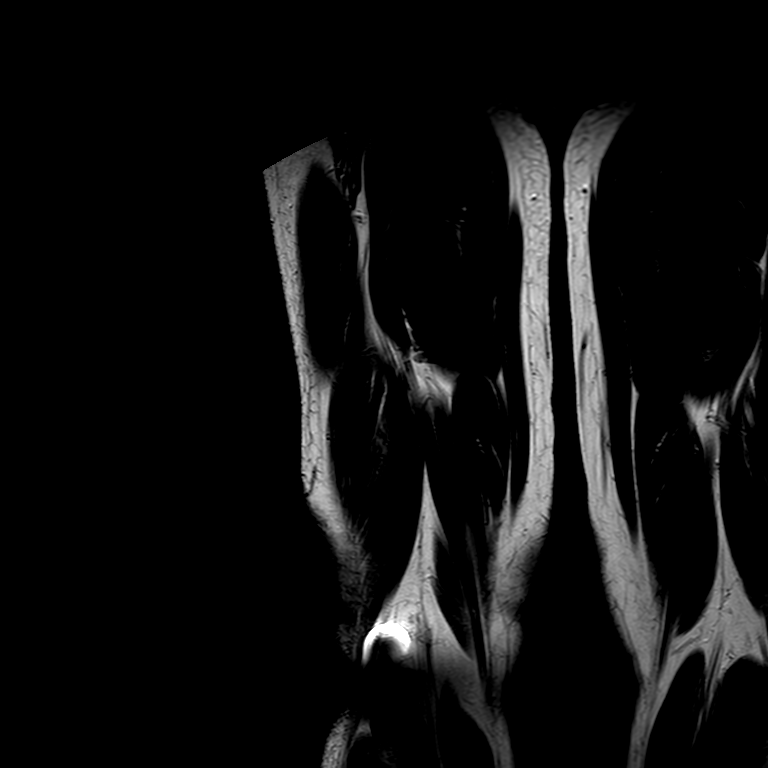

[Series 401: stir_(person_name) · coronal · 5.0mm · 0.52mm/px · 1 of 33 slices shown]
[im 7/33]
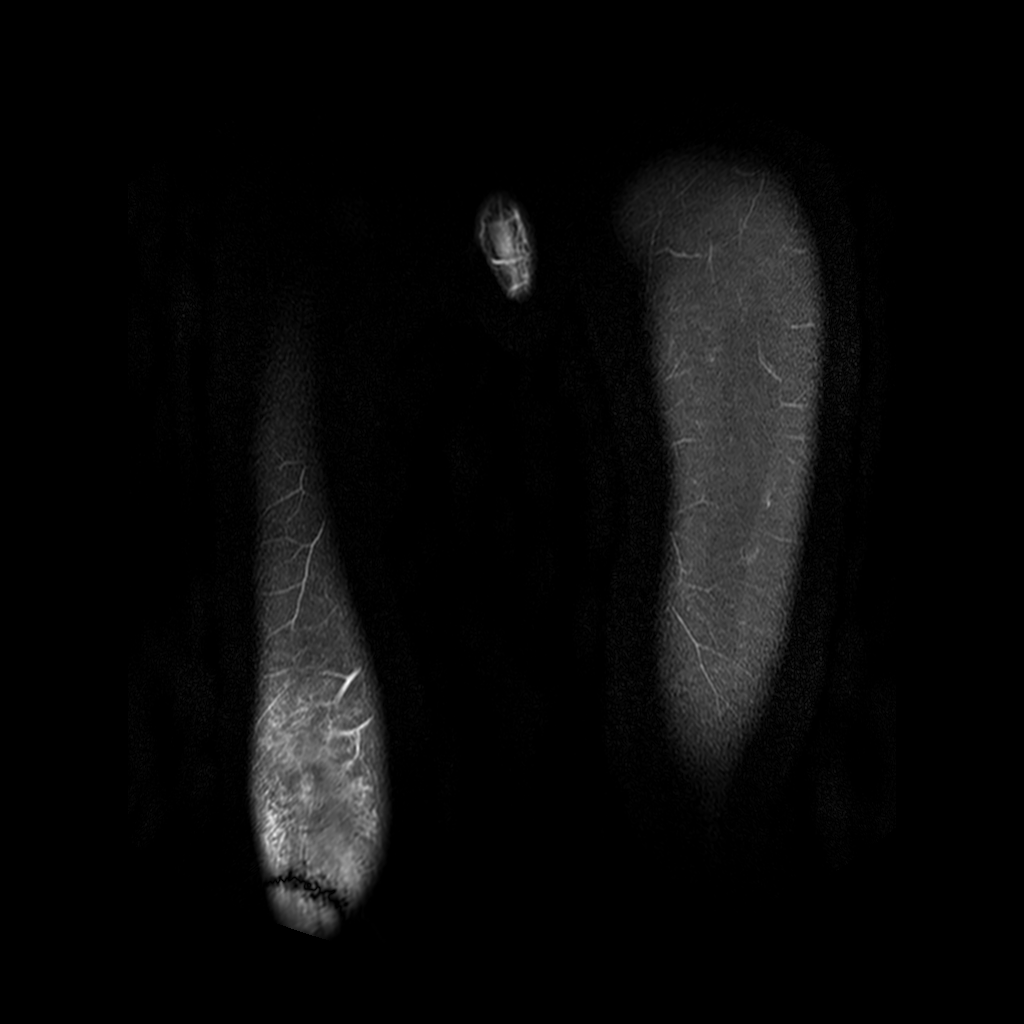

[7 of 40 positions shown; findings below may reference images not displayed]

FINDINGS: BONES: No fracture or subluxation. Pin tract through the distal femoral shaft. 
KNEE: Susceptibility artifact from right total knee arthroplasty. Small right 
knee joint effusion. 
MUSCLES/TENDONS: High-grade oblique tear through the right distal quadriceps 
tendon with up to 0.3 cm distraction of the tendinous remnants and fluid within 
the gap (sequence 401/701 image 9, sequence 601 images through 54-58). 1.9 x
x 3.3 cm evolving hematoma between the distal vastus medialis and intermedius 
muscles. Soft tissue swelling throughout the distal quadriceps muscles. Right 
mid rectus femoris fatty muscular atrophy along the myotendinous junction, 
consistent with remote strain. Right mid semitendinosus fatty muscular atrophy, 
consistent with remote strain. 
OTHER SOFT TISSUES: No mass. Anterior incisional scar. Soft tissue swelling.
IMPRESSION: 1.  High-grade oblique tear through the right distal quadriceps tendon with up 
to 0.3 cm distraction of the tendinous remnants and fluid within the gap.  
2.  1.9 x 1.1 x 3.3 cm evolving hematoma between the distal vastus medialis and 
intermedius muscles.  
3.  Soft tissue swelling throughout the distal quadriceps muscles.  
4.  Right total knee arthroplasty and small joint effusion.
